# Patient Record
Sex: Female | Born: 2000 | Hispanic: Yes | Marital: Single | Smoking: Never smoker
Health system: Southern US, Community
[De-identification: ages and names within clinical notes are randomized; demographics above are authoritative.]

## PROBLEM LIST (undated history)

## (undated) ENCOUNTER — Emergency Department (HOSPITAL_COMMUNITY): Admission: EM | Payer: Medicaid Other | Source: Home / Self Care

## (undated) DIAGNOSIS — L309 Dermatitis, unspecified: Secondary | ICD-10-CM

## (undated) DIAGNOSIS — J45909 Unspecified asthma, uncomplicated: Secondary | ICD-10-CM

## (undated) DIAGNOSIS — S80211A Abrasion, right knee, initial encounter: Secondary | ICD-10-CM

## (undated) DIAGNOSIS — L509 Urticaria, unspecified: Secondary | ICD-10-CM

## (undated) DIAGNOSIS — R2230 Localized swelling, mass and lump, unspecified upper limb: Secondary | ICD-10-CM

## (undated) DIAGNOSIS — S92911A Unspecified fracture of right toe(s), initial encounter for closed fracture: Secondary | ICD-10-CM

## (undated) DIAGNOSIS — T783XXA Angioneurotic edema, initial encounter: Secondary | ICD-10-CM

## (undated) HISTORY — DX: Dermatitis, unspecified: L30.9

## (undated) HISTORY — DX: Unspecified asthma, uncomplicated: J45.909

## (undated) HISTORY — DX: Urticaria, unspecified: L50.9

## (undated) HISTORY — DX: Angioneurotic edema, initial encounter: T78.3XXA

---
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
## 2022-02-01 DIAGNOSIS — H5213 Myopia, bilateral: Secondary | ICD-10-CM | POA: Diagnosis not present

## 2022-02-01 DIAGNOSIS — H52223 Regular astigmatism, bilateral: Secondary | ICD-10-CM | POA: Diagnosis not present

## 2022-02-06 NOTE — Telephone Encounter (Signed)
School forms have been up front and have not been picked up. Copy is scanned into media if needed. Original forms have been shredded.

## 2022-07-01 IMAGING — DX DG CHEST 1V PORT
1 series · 1 of 1 positions shown · non-contrast
Comparison: 09/07/2019

CLINICAL DATA: Cough.  COVID like symptoms.

EXAM:
PORTABLE CHEST 1 VIEW

[chest]
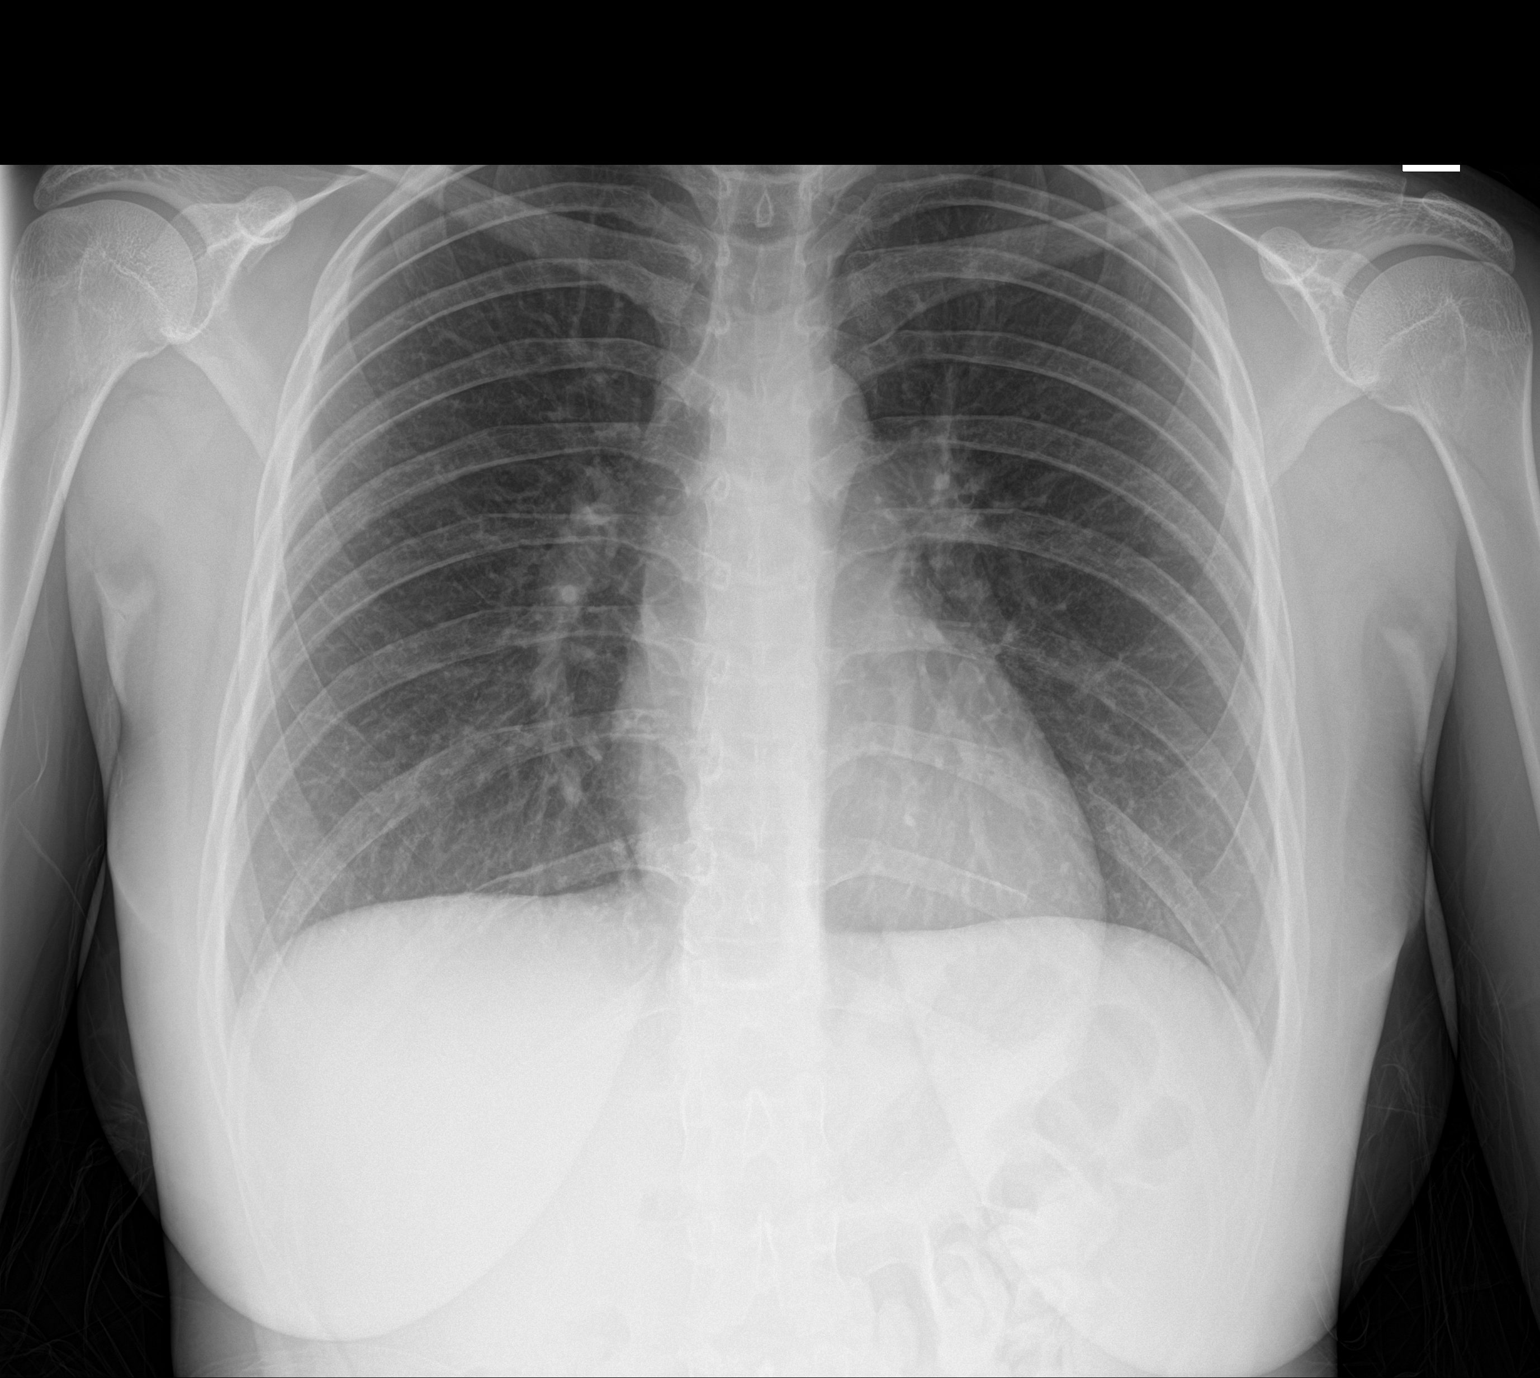

[1 of 1 positions shown; findings below may reference images not displayed]

FINDINGS: The heart size and mediastinal contours are within normal limits.
Both lungs are clear. The visualized skeletal structures are
unremarkable.
IMPRESSION: No active disease.

## 2022-09-10 ENCOUNTER — Telehealth: Payer: Self-pay

## 2022-09-10 NOTE — Telephone Encounter (Signed)
LVM for patient to call back. AS< CMA 

## 2022-10-05 ENCOUNTER — Encounter (HOSPITAL_COMMUNITY): Payer: Self-pay | Admitting: Emergency Medicine

## 2022-10-05 ENCOUNTER — Emergency Department (HOSPITAL_COMMUNITY): Payer: Medicaid Other

## 2022-10-05 ENCOUNTER — Other Ambulatory Visit: Payer: Self-pay

## 2022-10-05 ENCOUNTER — Emergency Department (HOSPITAL_COMMUNITY)
Admission: EM | Admit: 2022-10-05 | Discharge: 2022-10-06 | Disposition: A | Discharge status: Home / Self Care | Payer: Medicaid Other | Attending: Emergency Medicine | Admitting: Emergency Medicine

## 2022-10-05 DIAGNOSIS — R1012 Left upper quadrant pain: Secondary | ICD-10-CM | POA: Diagnosis not present

## 2022-10-05 DIAGNOSIS — R1013 Epigastric pain: Secondary | ICD-10-CM | POA: Diagnosis not present

## 2022-10-05 DIAGNOSIS — R079 Chest pain, unspecified: Secondary | ICD-10-CM | POA: Diagnosis not present

## 2022-10-05 DIAGNOSIS — D72829 Elevated white blood cell count, unspecified: Secondary | ICD-10-CM | POA: Diagnosis not present

## 2022-10-05 DIAGNOSIS — R319 Hematuria, unspecified: Secondary | ICD-10-CM | POA: Diagnosis not present

## 2022-10-05 DIAGNOSIS — R109 Unspecified abdominal pain: Secondary | ICD-10-CM | POA: Diagnosis not present

## 2022-10-05 DIAGNOSIS — R1011 Right upper quadrant pain: Secondary | ICD-10-CM | POA: Insufficient documentation

## 2022-10-05 LAB — I-STAT BETA HCG BLOOD, ED (MC, WL, AP ONLY): I-stat hCG, quantitative: <5 m[IU]/mL (ref <5)

## 2022-10-05 LAB — CBC
HCT: 39.1 % (ref 36.0–46.0)
Hemoglobin: 12.9 g/dL (ref 12.0–15.0)
MCH: 27.2 pg (ref 26.0–34.0)
MCHC: 33 g/dL (ref 30.0–36.0)
MCV: 82.5 fL (ref 80.0–100.0)
Platelets: 361 10*3/uL (ref 150–400)
RBC: 4.74 MIL/uL (ref 3.87–5.11)
RDW: 14.5 % (ref 11.5–15.5)
WBC: 11.3 10*3/uL — ABNORMAL HIGH (ref 4.0–10.5)
nRBC: 0 % (ref 0.0–0.2)

## 2022-10-05 LAB — COMPREHENSIVE METABOLIC PANEL
ALT: 14 U/L (ref 0–44)
AST: 19 U/L (ref 15–41)
Albumin: 4.5 g/dL (ref 3.5–5.0)
Alkaline Phosphatase: 93 U/L (ref 38–126)
Anion gap: 12 (ref 5–15)
BUN: 6 mg/dL (ref 6–20)
CO2: 22 mmol/L (ref 22–32)
Calcium: 9.3 mg/dL (ref 8.9–10.3)
Chloride: 103 mmol/L (ref 98–111)
Creatinine, Ser: 0.6 mg/dL (ref 0.44–1.00)
GFR, Estimated: >60 mL/min (ref >60)
Glucose, Bld: 112 mg/dL — ABNORMAL HIGH (ref 70–99)
Potassium: 3.7 mmol/L (ref 3.5–5.1)
Sodium: 137 mmol/L (ref 135–145)
Total Bilirubin: 0.3 mg/dL (ref 0.3–1.2)
Total Protein: 8.2 g/dL — ABNORMAL HIGH (ref 6.5–8.1)

## 2022-10-05 LAB — TROPONIN I (HIGH SENSITIVITY): Troponin I (High Sensitivity): <2 ng/L (ref <18)

## 2022-10-05 LAB — LIPASE, BLOOD: Lipase: 38 U/L (ref 11–51)

## 2022-10-05 NOTE — ED Triage Notes (Signed)
Pt in with R flank pain that began yesterday, and pain now radiates into chest since this morning. +nausea. Pt denies urinary symptoms or fevers, states the pain does radiate into RUQ as well.

## 2022-10-06 ENCOUNTER — Emergency Department (HOSPITAL_COMMUNITY): Payer: Medicaid Other

## 2022-10-06 DIAGNOSIS — R109 Unspecified abdominal pain: Secondary | ICD-10-CM | POA: Diagnosis not present

## 2022-10-06 LAB — URINALYSIS, ROUTINE W REFLEX MICROSCOPIC: RBC / HPF: >50 RBC/hpf (ref 0–5)

## 2022-10-06 LAB — TROPONIN I (HIGH SENSITIVITY): Troponin I (High Sensitivity): <2 ng/L (ref <18)

## 2022-10-06 MED ORDER — DICYCLOMINE HCL 20 MG PO TABS: 20.0000 mg | ORAL_TABLET | Freq: Two times a day (BID) | ORAL | 0 refills | Status: DC | PRN

## 2022-10-06 MED ORDER — ONDANSETRON HCL 4 MG/2ML IJ SOLN
4.0000 mg | Freq: Once | INTRAMUSCULAR | Status: AC
  Administered 2022-10-06: 4 mg via INTRAVENOUS
  Filled 2022-10-06: qty 2

## 2022-10-06 MED ORDER — HYDROMORPHONE HCL 1 MG/ML IJ SOLN
0.5000 mg | Freq: Once | INTRAMUSCULAR | Status: AC
  Administered 2022-10-06: 0.5 mg via INTRAVENOUS
  Filled 2022-10-06: qty 1

## 2022-10-06 MED ORDER — SODIUM CHLORIDE 0.9 % IV BOLUS
1000.0000 mL | Freq: Once | INTRAVENOUS | Status: AC
Start: 2022-10-06 — End: 2022-10-06
  Administered 2022-10-06: 1000 mL via INTRAVENOUS

## 2022-10-06 MED ORDER — IOHEXOL 350 MG/ML SOLN
75.0000 mL | Freq: Once | INTRAVENOUS | Status: AC | PRN
  Administered 2022-10-06: 75 mL via INTRAVENOUS

## 2022-10-06 MED ORDER — FAMOTIDINE 20 MG PO TABS: 20.0000 mg | ORAL_TABLET | Freq: Two times a day (BID) | ORAL | 0 refills | Status: AC

## 2022-10-06 MED ORDER — ONDANSETRON HCL 4 MG PO TABS: 4.0000 mg | ORAL_TABLET | Freq: Four times a day (QID) | ORAL | 0 refills | Status: AC

## 2022-10-06 NOTE — Discharge Instructions (Addendum)
Likely you have a viral stomach bug which should resolve on its own, I recommend a bland diet and then increasing diet as tolerated.  I have given you a medication called Zofran and this will help with nausea, Pepcid which will help with stomach acid, and Bentyl for stomach spasms.  Follow-up with your PCP as needed  Come back to the emergency department if you develop chest pain, shortness of breath, severe abdominal pain, uncontrolled nausea, vomiting, diarrhea.

## 2022-10-06 NOTE — ED Notes (Signed)
Pt attempting for urine sample at this time. Still c/o RUQ/R flank pain

## 2022-10-06 NOTE — ED Provider Notes (Signed)
[place] EMERGENCY DEPARTMENT AT [facility] Provider Note   CSN: [id number] Arrival date & time: 10/05/22  2228     History  Chief Complaint  Patient presents with   Chest Pain   Flank Pain    Heather [name] is a 22 y.o. female.  HPI   Patient without significant medical history presented with complaints of abdominal pain.  Patient states that it started yesterday, came on suddenly, pain is mainly in gastric region as well as her left right upper quadrant, states she will feel into her flanks bilaterally, she denies any urinary symptoms dysuria hematuria but does endorse that she started her menstrual cycle was having vaginal bleeding she is currently not on birth control, no history of ovarian torsion or ectopic pregnancies.  She denies pleuritic chest pain shortness of breath, no history of PEs or DVTs currently not on oral birth control no recent surgeries no long immobilizations.  No abdominal surgeries she has never had anything like the past.    Home Medications Prior to Admission medications   Medication Sig Start Date End Date Taking? Authorizing Provider  cetirizine (ZYRTEC) 10 MG tablet Take 1 tablet (10 mg total) by mouth daily as needed for allergies. Patient not taking: Reported on 07/31/2021 10/25/20   Hetty Blend, FNP  clindamycin-benzoyl peroxide [facility]) gel Apply topically 2 (two) times daily. Patient not taking: Reported on 07/31/2021 10/25/20   Herrin, Purvis Kilts, MD  EPINEPHrine (EPIPEN 2-PAK) 0.3 mg/0.3 mL IJ SOAJ injection Inject 0.3 mg into the muscle once as needed (for severe allergic reaction). CAll 911 immediately if you have to use this medicine Patient not taking: Reported on 07/31/2021 10/25/20   Hetty Blend, FNP  fluticasone [facility]) 50 MCG/ACT nasal spray 2 sprays in each nostril daily as needed. Use for 1-2 weeks at a time before stopping once symptoms improve. Patient not taking: Reported on 07/31/2021 11/15/19   Marcelyn Bruins, MD  PATADAY 0.2 % SOLN Place 1 drop into both eyes daily as needed (itchy/watery/red eyes). Patient not taking: Reported on 07/31/2021 11/15/19   Marcelyn Bruins, MD      Allergies    Catfish [fish allergy], Amoxil [amoxicillin], Blackberry [rubus fruticosus], Blueberry [vaccinium angustifolium], Raspberry, and Shrimp [shellfish allergy]    Review of Systems   Review of Systems  Constitutional:  Negative for chills and fever.  Respiratory:  Negative for shortness of breath.   Cardiovascular:  Negative for chest pain.  Gastrointestinal:  Positive for abdominal pain and diarrhea. Negative for vomiting.  Genitourinary:  Positive for flank pain and vaginal bleeding.  Neurological:  Negative for headaches.    Physical Exam Updated Vital Signs BP 132/82 (BP Location: Left Arm)   Pulse 76   Temp 98.5 F (36.9 C) (Oral)   Resp 16   Wt 59 kg   LMP 10/04/2022   SpO2 97%   BMI 25.39 kg/m  Physical Exam Vitals and nursing note reviewed.  Constitutional:      General: She is not in acute distress.    Appearance: She is not ill-appearing.  HENT:     Head: Normocephalic and atraumatic.     Nose: No congestion.  Eyes:     Conjunctiva/sclera: Conjunctivae normal.  Cardiovascular:     Rate and Rhythm: Normal rate and regular rhythm.     Pulses: Normal pulses.     Heart sounds: No murmur heard.    No friction rub. No gallop.  Pulmonary:  Effort: No respiratory distress.     Breath sounds: No wheezing, rhonchi or rales.  Abdominal:     Palpations: Abdomen is soft.     Tenderness: There is abdominal tenderness. There is no right CVA tenderness or left CVA tenderness.     Comments: Abdomen nondistended, soft, noted tenderness in the epigastric as well as right left upper quadrant, she has slight flank tenderness bilaterally, no guarding rebound tenderness or peritoneal sign she had no tenderness on the lower abdomen or suprapubic region  Musculoskeletal:      Right lower leg: No edema.     Left lower leg: No edema.     Comments: There is no unilateral leg swelling no calf tenderness no palpable cords.  Skin:    General: Skin is warm and dry.  Neurological:     Mental Status: She is alert.  Psychiatric:        Mood and Affect: Mood normal.     ED Results / Procedures / Treatments   Labs (all labs ordered are listed, but only abnormal results are displayed) Labs Reviewed  CBC - Abnormal; Notable for the following components:      Result Value   WBC 11.3 (*)    All other components within normal limits  COMPREHENSIVE METABOLIC PANEL - Abnormal; Notable for the following components:   Glucose, Bld 112 (*)    Total Protein 8.2 (*)    All other components within normal limits  URINALYSIS, ROUTINE W REFLEX MICROSCOPIC - Abnormal; Notable for the following components:   Color, Urine RED (*)    APPearance TURBID (*)    Glucose, UA   (*)    Value: TEST NOT REPORTED DUE TO COLOR INTERFERENCE OF URINE PIGMENT   Hgb urine dipstick   (*)    Value: TEST NOT REPORTED DUE TO COLOR INTERFERENCE OF URINE PIGMENT   Bilirubin Urine   (*)    Value: TEST NOT REPORTED DUE TO COLOR INTERFERENCE OF URINE PIGMENT   Ketones, ur   (*)    Value: TEST NOT REPORTED DUE TO COLOR INTERFERENCE OF URINE PIGMENT   Protein, ur   (*)    Value: TEST NOT REPORTED DUE TO COLOR INTERFERENCE OF URINE PIGMENT   Nitrite   (*)    Value: TEST NOT REPORTED DUE TO COLOR INTERFERENCE OF URINE PIGMENT   Leukocytes,Ua   (*)    Value: TEST NOT REPORTED DUE TO COLOR INTERFERENCE OF URINE PIGMENT   Bacteria, UA FEW (*)    All other components within normal limits  LIPASE, BLOOD  I-STAT BETA HCG BLOOD, ED ([facility], [facility], [facility] ONLY)  TROPONIN I (HIGH SENSITIVITY)  TROPONIN I (HIGH SENSITIVITY)    EKG None  Radiology DG Chest 2 View  Result Date: 10/05/2022 CLINICAL DATA:  Chest pain and pressure. EXAM: CHEST - 2 VIEW COMPARISON:  07/29/2021. FINDINGS: The heart size and mediastinal  contours are within normal limits. Both lungs are clear. No acute osseous abnormality. IMPRESSION: No active cardiopulmonary disease. Electronically Signed   By: Thornell Sartorius M.D.   On: 10/05/2022 23:29    Procedures Procedures    Medications Ordered in ED Medications  HYDROmorphone (DILAUDID) injection 0.5 mg (has no administration in time range)  sodium chloride 0.9 % bolus 1,000 mL (has no administration in time range)  ondansetron (ZOFRAN) injection 4 mg (has no administration in time range)    ED Course/ Medical Decision Making/ A&P  Medical Decision Making Amount and/or Complexity of Data Reviewed Labs: ordered. Radiology: ordered.  Risk Prescription drug management.   This patient presents to the ED for concern of abdominal pain, this involves an extensive number of treatment options, and is a complaint that carries with it a high risk of complications and morbidity.  The differential diagnosis includes lower lobe pneumonia, PE, pancreatitis, cholecystitis, kidney stone, pyelonephritis    Additional history obtained:  Additional history obtained from mother at bedside External records from outside source obtained and reviewed including ER notes   Co morbidities that complicate the patient evaluation  N/A  Social Determinants of Health:  N/A    Lab Tests:  I Ordered, and personally interpreted labs.  The pertinent results include: CBC shows slight leukocytosis 11.3, CMP reveals glucose 112, total protein 8.2, lipase 38, UA shows red blood cells few white blood cells few bacteria hCG negative negative delta troponin   Imaging Studies ordered:  I ordered imaging studies including chest x-ray, CT abdomen pelvis I independently visualized and interpreted imaging which showed chest x-ray negative I agree with the radiologist interpretation   Cardiac Monitoring:  The patient was maintained on a cardiac monitor.  I personally  viewed and interpreted the cardiac monitored which showed an underlying rhythm of: EKG without signs of ischemia   Medicines ordered and prescription drug management:  I ordered medication including fluids, pain medication, antiemetics, I have reviewed the patients home medicines and have made adjustments as needed  Critical Interventions:  N/A   Reevaluation:  With abdominal pain triage obtain basic lab workup imaging which I personally reviewed, notable for leukocytosis as well as hematuria, patient noted abdominal tenderness, unclear if this is pyelonephritis versus kidney stone versus acute cholecystitis will obtain CT imaging for further evaluation.  Patient is reassessed resting comfortably having no complaints she is agreement with discharge at this time.    Consultations Obtained:  N/A    Test Considered:  N/A    Rule out Doubt ectopic pregnancy as hCG is negative.  Suspicion for ovarian torsion is low at this time she has no adnexal pain no suprapubic pain no noted adnexal mass seen on CT imaging.  Suspicion for PID is low she denies any pelvic pain vaginal discharge.  I doubt UTI or [place] or kidney stone patient denies any urinary symptoms CT imaging negative for evidence of inflammation around the kidneys or stone.  She does have blood in her urine but this is likely secondary due to her menstrual cycle.  Suspicion for cholecystitis, cholangitis, choledocholithiasis is low as no elevation in liver enzymes, alk phos, T. bili, no ductal dilation seen on CT imaging.  I do not pancreatitis lipase within normal limits.  I doubt intra-abdominal infection, volvulus, bowel obstruction CT imaging negative these findings.  I doubt lower lobe pneumonia as lung sounds are clear bilaterally.  I doubt PE as she is PERC negative, I doubt ACS as she has negative delta troponin EKG without signs of ischemia.    Dispostion and problem list  After consideration of the diagnostic  results and the patients response to treatment, I feel that the patent would benefit from discharge.  Abdominal pain-suspect viral gastritis, will recommend bland diet, provide with antiemetics follow-up PCP for further evaluation and strict return precautions.            Final Clinical Impression(s) / ED Diagnoses Final diagnoses:  None    Rx / DC Orders ED Discharge Orders  None         Carroll Sage, PA-C 10/06/22 [id number]    Sabas Sous, MD 10/06/22 [phone]

## 2022-11-12 ENCOUNTER — Emergency Department (HOSPITAL_COMMUNITY): Payer: Medicaid Other

## 2022-11-12 ENCOUNTER — Emergency Department (HOSPITAL_COMMUNITY)
Admission: EM | Admit: 2022-11-12 | Discharge: 2022-11-12 | Disposition: A | Discharge status: Home / Self Care | Payer: Medicaid Other | Attending: Emergency Medicine | Admitting: Emergency Medicine

## 2022-11-12 ENCOUNTER — Other Ambulatory Visit: Payer: Self-pay

## 2022-11-12 DIAGNOSIS — Z79899 Other long term (current) drug therapy: Secondary | ICD-10-CM | POA: Insufficient documentation

## 2022-11-12 DIAGNOSIS — R1013 Epigastric pain: Secondary | ICD-10-CM | POA: Diagnosis not present

## 2022-11-12 DIAGNOSIS — R110 Nausea: Secondary | ICD-10-CM | POA: Insufficient documentation

## 2022-11-12 DIAGNOSIS — R197 Diarrhea, unspecified: Secondary | ICD-10-CM | POA: Diagnosis not present

## 2022-11-12 DIAGNOSIS — R1084 Generalized abdominal pain: Secondary | ICD-10-CM | POA: Diagnosis not present

## 2022-11-12 DIAGNOSIS — R109 Unspecified abdominal pain: Secondary | ICD-10-CM | POA: Diagnosis present

## 2022-11-12 LAB — URINALYSIS, ROUTINE W REFLEX MICROSCOPIC
Bilirubin Urine: NEGATIVE
Glucose, UA: NEGATIVE mg/dL
Hgb urine dipstick: NEGATIVE
Ketones, ur: NEGATIVE mg/dL
Leukocytes,Ua: NEGATIVE
Nitrite: NEGATIVE
Protein, ur: NEGATIVE mg/dL
Specific Gravity, Urine: 1.011 (ref 1.005–1.030)
pH: 6 (ref 5.0–8.0)

## 2022-11-12 LAB — CBC WITH DIFFERENTIAL/PLATELET
Abs Immature Granulocytes: 0.05 10*3/uL (ref 0.00–0.07)
Basophils Absolute: 0 10*3/uL (ref 0.0–0.1)
Basophils Relative: 0 %
Eosinophils Absolute: 0.3 10*3/uL (ref 0.0–0.5)
Eosinophils Relative: 2 %
HCT: 37.3 % (ref 36.0–46.0)
Hemoglobin: 12.3 g/dL (ref 12.0–15.0)
Immature Granulocytes: 0 %
Lymphocytes Relative: 16 %
Lymphs Abs: 1.9 10*3/uL (ref 0.7–4.0)
MCH: 27.4 pg (ref 26.0–34.0)
MCHC: 33 g/dL (ref 30.0–36.0)
MCV: 83.1 fL (ref 80.0–100.0)
Monocytes Absolute: 0.5 10*3/uL (ref 0.1–1.0)
Monocytes Relative: 4 %
Neutro Abs: 9.4 10*3/uL — ABNORMAL HIGH (ref 1.7–7.7)
Neutrophils Relative %: 78 %
Platelets: 278 10*3/uL (ref 150–400)
RBC: 4.49 MIL/uL (ref 3.87–5.11)
RDW: 14 % (ref 11.5–15.5)
WBC: 12.1 10*3/uL — ABNORMAL HIGH (ref 4.0–10.5)
nRBC: 0 % (ref 0.0–0.2)

## 2022-11-12 LAB — COMPREHENSIVE METABOLIC PANEL
ALT: 116 U/L — ABNORMAL HIGH (ref 0–44)
AST: 258 U/L — ABNORMAL HIGH (ref 15–41)
Albumin: 4.4 g/dL (ref 3.5–5.0)
Alkaline Phosphatase: 101 U/L (ref 38–126)
Anion gap: 10 (ref 5–15)
BUN: 10 mg/dL (ref 6–20)
CO2: 22 mmol/L (ref 22–32)
Calcium: 9.1 mg/dL (ref 8.9–10.3)
Chloride: 104 mmol/L (ref 98–111)
Creatinine, Ser: 0.56 mg/dL (ref 0.44–1.00)
GFR, Estimated: >60 mL/min (ref >60)
Glucose, Bld: 120 mg/dL — ABNORMAL HIGH (ref 70–99)
Potassium: 3.3 mmol/L — ABNORMAL LOW (ref 3.5–5.1)
Sodium: 136 mmol/L (ref 135–145)
Total Bilirubin: 0.8 mg/dL (ref 0.3–1.2)
Total Protein: 7.7 g/dL (ref 6.5–8.1)

## 2022-11-12 LAB — LIPASE, BLOOD: Lipase: 33 U/L (ref 11–51)

## 2022-11-12 LAB — RAPID URINE DRUG SCREEN, HOSP PERFORMED
Amphetamines: NOT DETECTED
Barbiturates: NOT DETECTED
Benzodiazepines: NOT DETECTED
Cocaine: NOT DETECTED
Opiates: NOT DETECTED
Tetrahydrocannabinol: NOT DETECTED

## 2022-11-12 MED ORDER — ONDANSETRON HCL 4 MG/2ML IJ SOLN
4.0000 mg | Freq: Once | INTRAMUSCULAR | Status: AC
  Administered 2022-11-12: 4 mg via INTRAVENOUS
  Filled 2022-11-12: qty 2

## 2022-11-12 MED ORDER — POTASSIUM CHLORIDE CRYS ER 20 MEQ PO TBCR
40.0000 meq | EXTENDED_RELEASE_TABLET | Freq: Once | ORAL | Status: AC
  Administered 2022-11-12: 40 meq via ORAL
  Filled 2022-11-12: qty 2

## 2022-11-12 MED ORDER — LACTATED RINGERS IV BOLUS
2000.0000 mL | Freq: Once | INTRAVENOUS | Status: AC
  Administered 2022-11-12: 2000 mL via INTRAVENOUS

## 2022-11-12 MED ORDER — ONDANSETRON HCL 4 MG/2ML IJ SOLN
4.0000 mg | Freq: Once | INTRAMUSCULAR | Status: DC
  Filled 2022-11-12: qty 2

## 2022-11-12 MED ORDER — ONDANSETRON 4 MG PO TBDP: 4.0000 mg | ORAL_TABLET | Freq: Three times a day (TID) | ORAL | 0 refills | Status: AC | PRN

## 2022-11-12 MED ORDER — DICYCLOMINE HCL 20 MG PO TABS: 20.0000 mg | ORAL_TABLET | Freq: Two times a day (BID) | ORAL | 0 refills | Status: AC

## 2022-11-12 MED ORDER — MORPHINE SULFATE (PF) 4 MG/ML IV SOLN
4.0000 mg | Freq: Once | INTRAVENOUS | Status: DC
  Filled 2022-11-12: qty 1

## 2022-11-12 MED ORDER — PANTOPRAZOLE SODIUM 40 MG PO TBEC: 40.0000 mg | DELAYED_RELEASE_TABLET | Freq: Every day | ORAL | 0 refills | Status: AC

## 2022-11-12 MED ORDER — HYDROCODONE-ACETAMINOPHEN 5-325 MG PO TABS
2.0000 | ORAL_TABLET | Freq: Once | ORAL | Status: AC
  Administered 2022-11-12: 2 via ORAL
  Filled 2022-11-12: qty 2

## 2022-11-12 MED ORDER — ONDANSETRON 8 MG PO TBDP
8.0000 mg | ORAL_TABLET | Freq: Once | ORAL | Status: AC
  Administered 2022-11-12: 8 mg via ORAL
  Filled 2022-11-12: qty 1

## 2022-11-12 NOTE — ED Provider Notes (Signed)
[place] EMERGENCY DEPARTMENT AT [facility] Provider Note   CSN: [id number] Arrival date & time: 11/12/22  4782     History  Chief Complaint  Patient presents with   Abdominal Pain    Heather [name] is a 22 y.o. female.  22 year old female presents today for evaluation of abdominal pain that started yesterday.  Associated nausea but no vomiting.  No fever, dysuria, vaginal discharge.  She states pain is generalized however worse in the epigastric region.  Does radiate to the back.  Seen about a month ago for abdominal pain.  States this feels different and is worse.  The history is provided by the patient. No language interpreter was used.       Home Medications Prior to Admission medications   Medication Sig Start Date End Date Taking? Authorizing Provider  cetirizine (ZYRTEC) 10 MG tablet Take 1 tablet (10 mg total) by mouth daily as needed for allergies. Patient not taking: Reported on 07/31/2021 10/25/20   Hetty Blend, FNP  clindamycin-benzoyl peroxide [facility]) gel Apply topically 2 (two) times daily. Patient not taking: Reported on 07/31/2021 10/25/20   Herrin, Purvis Kilts, MD  dicyclomine (BENTYL) 20 MG tablet Take 1 tablet (20 mg total) by mouth 2 (two) times daily as needed for spasms. 10/06/22   Carroll Sage, PA-C  EPINEPHrine (EPIPEN 2-PAK) 0.3 mg/0.3 mL IJ SOAJ injection Inject 0.3 mg into the muscle once as needed (for severe allergic reaction). CAll 911 immediately if you have to use this medicine Patient not taking: Reported on 07/31/2021 10/25/20   Hetty Blend, FNP  famotidine (PEPCID) 20 MG tablet Take 1 tablet (20 mg total) by mouth 2 (two) times daily for 7 days. 10/06/22 10/13/22  Carroll Sage, PA-C  fluticasone (FLONASE) 50 MCG/ACT nasal spray 2 sprays in each nostril daily as needed. Use for 1-2 weeks at a time before stopping once symptoms improve. Patient not taking: Reported on 07/31/2021 11/15/19   Marcelyn Bruins, MD  ondansetron (ZOFRAN) 4 MG tablet Take 1 tablet (4 mg total) by mouth every 6 (six) hours. 10/06/22   Carroll Sage, PA-C  PATADAY 0.2 % SOLN Place 1 drop into both eyes daily as needed (itchy/watery/red eyes). Patient not taking: Reported on 07/31/2021 11/15/19   Marcelyn Bruins, MD      Allergies    Catfish [fish allergy], Amoxil [amoxicillin], Blackberry [rubus fruticosus], Blueberry [vaccinium angustifolium], Raspberry, and Shrimp [shellfish allergy]    Review of Systems   Review of Systems  Constitutional:  Negative for fever.  Gastrointestinal:  Positive for nausea. Negative for abdominal pain and vomiting.  Genitourinary:  Negative for dysuria, flank pain, pelvic pain and vaginal discharge.  All other systems reviewed and are negative.   Physical Exam Updated Vital Signs BP 138/80 (BP Location: Right Arm)   Pulse 87   Temp 98 F (36.7 C) (Oral)   Resp 16   Ht 5\' 1"  (1.549 m)   Wt 59 kg   LMP 11/05/2022 (Approximate)   SpO2 100%   BMI 24.58 kg/m  Physical Exam Vitals and nursing note reviewed.  Constitutional:      General: She is not in acute distress.    Appearance: Normal appearance. She is not ill-appearing.  HENT:     Head: Normocephalic and atraumatic.     Nose: Nose normal.  Eyes:     General: No scleral icterus.    Extraocular Movements: Extraocular movements intact.     Conjunctiva/sclera:  Conjunctivae normal.  Cardiovascular:     Rate and Rhythm: Normal rate and regular rhythm.     Heart sounds: Normal heart sounds.  Pulmonary:     Effort: Pulmonary effort is normal. No respiratory distress.     Breath sounds: Normal breath sounds. No wheezing or rales.  Abdominal:     General: There is no distension.     Tenderness: There is abdominal tenderness (epigastric region). There is no guarding.  Musculoskeletal:        General: Normal range of motion.     Cervical back: Normal range of motion.  Skin:    General: Skin is warm  and dry.  Neurological:     General: No focal deficit present.     Mental Status: She is alert. Mental status is at baseline.     ED Results / Procedures / Treatments   Labs (all labs ordered are listed, but only abnormal results are displayed) Labs Reviewed - No data to display  EKG None  Radiology No results found.  Procedures Procedures    Medications Ordered in ED Medications  morphine (PF) 4 MG/ML injection 4 mg (has no administration in time range)  lactated ringers bolus 2,000 mL (has no administration in time range)  ondansetron (ZOFRAN) injection 4 mg (has no administration in time range)    ED Course/ Medical Decision Making/ A&P                             Medical Decision Making Amount and/or Complexity of Data Reviewed Labs: ordered. Radiology: ordered.  Risk Prescription drug management.   Medical Decision Making / ED Course   This patient presents to the ED for concern of abdominal pain, this involves an extensive number of treatment options, and is a complaint that carries with it a high risk of complications and morbidity.  The differential diagnosis includes gastritis, gastroenteritis, appendicitis, cholecystitis, pancreatitis  MDM: 22 year old female presents today for 1 day duration of abdominal pain associated with nausea, as well as back pain.  No emesis.  Denies other complaints.  Hemodynamically stable.  No acute distress noted.  Will obtain labs, provide fluids and pain control and reevaluate.  Improved on reevaluation.  LFTs elevated.  AST and ALT 258 and 116 respectively.  Potassium 3.3.  Potassium repletion given.  CBC with mild leukocytosis no significant left shift.  No anemia.  UA without UTI.  UDS unremarkable.  Lipase within normal limit.  Right upper quadrant ultrasound obtained given pain as in the epigastric region and right upper quadrant.  LFTs elevated.  No acute findings on ultrasound.  Discussed obtaining a CT scan however she  just had this 1 month ago and defers.  Pain is improved.  Abdominal exam without signs of acute abdomen.  Patient is appropriate for discharge.  Discharged in stable condition.  Will give some Protonix, Bentyl and Zofran.  GI referral given.  Lab Tests: -I ordered, reviewed, and interpreted labs.   The pertinent results include:   Labs Reviewed  CBC WITH DIFFERENTIAL/PLATELET  COMPREHENSIVE METABOLIC PANEL  LIPASE, BLOOD  URINALYSIS, ROUTINE W REFLEX MICROSCOPIC  RAPID URINE DRUG SCREEN, HOSP PERFORMED      EKG  EKG Interpretation  Date/Time:    Ventricular Rate:    PR Interval:    QRS Duration:   QT Interval:    QTC Calculation:   R Axis:     Text Interpretation:  Imaging Studies ordered: I ordered imaging studies including ruq [place] I independently visualized and interpreted imaging. I agree with the radiologist interpretation   Medicines ordered and prescription drug management: Meds ordered this encounter  Medications   morphine (PF) 4 MG/ML injection 4 mg   lactated ringers bolus 2,000 mL   ondansetron (ZOFRAN) injection 4 mg    -I have reviewed the patients home medicines and have made adjustments as needed  Reevaluation: After the interventions noted above, I reevaluated the patient and found that they have :improved  Co morbidities that complicate the patient evaluation  Past Medical History:  Diagnosis Date   Abrasion of knee, right 01/10/2015   Angio-edema    Mass of arm 12/2014   nodular swellings left upper arm   Toe fracture, right    Urticaria       Dispostion: Patient discharged in stable condition peer return precaution discussed.  Patient voiced understanding and is in agreement with plan.  Final Clinical Impression(s) / ED Diagnoses Final diagnoses:  Epigastric pain    Rx / DC Orders ED Discharge Orders          Ordered    pantoprazole (PROTONIX) 40 MG tablet  Daily        11/12/22 1203    ondansetron (ZOFRAN-ODT) 4  MG disintegrating tablet  Every 8 hours PRN        11/12/22 1206    dicyclomine (BENTYL) 20 MG tablet  2 times daily        11/12/22 1206              Marita Kansas, PA-C 11/12/22 1218    Tegeler, Canary Brim, MD 11/12/22 1336

## 2022-11-12 NOTE — Discharge Instructions (Signed)
Your workup is overall reassuring.  Ultrasound does not show any concerning findings.  Have given you referral to gastroenterology.  I have sent nausea medication into the pharmacy for you.  If you have any concerning symptoms return to the emergency room.

## 2023-04-16 DIAGNOSIS — H5213 Myopia, bilateral: Secondary | ICD-10-CM | POA: Diagnosis not present

## 2023-12-03 ENCOUNTER — Ambulatory Visit: Admitting: Family Medicine

## 2023-12-03 ENCOUNTER — Encounter: Payer: Self-pay | Admitting: Family Medicine

## 2023-12-03 VITALS — BP 130/80 | HR 108 | Temp 98.8°F | Ht 61.0 in | Wt 139.0 lb

## 2023-12-03 DIAGNOSIS — L700 Acne vulgaris: Secondary | ICD-10-CM

## 2023-12-03 MED ORDER — RETIN-A 0.05 % EX CREA
TOPICAL_CREAM | Freq: Every day | CUTANEOUS | 0 refills | Status: AC
Start: 2023-12-03 — End: ?

## 2023-12-03 NOTE — Progress Notes (Signed)
 New Patient Visit  Subjective:     Patient ID: Heather [name], female    DOB: July 28, 2000, 22 y.o.   MRN: [id number]  No chief complaint on file.   HPI  Discussed the use of AI scribe software for clinical note transcription with the patient, who gave verbal consent to proceed.  History of Present Illness Heather [name] is a 23 year old female who presents with persistent acne.  Acneiform eruptions - Persistent, intermittent acne with most severe outbreaks on the back - Minimal relief with over-the-counter treatments, including Dove Sensitive bar soap - No significant improvement with Panoxyl wash - No associated symptoms such as pruritus, pain, or drainage  Constitutional symptoms - No issues with eating or sleeping - Feels generally well aside from acne - No other symptoms present     ROS Per HPI  Outpatient Encounter Medications as of 12/03/2023  Medication Sig   tretinoin (RETIN-A) 0.05 % cream Apply topically at bedtime.   cetirizine  (ZYRTEC ) 10 MG tablet Take 1 tablet (10 mg total) by mouth daily as needed for allergies. (Patient not taking: Reported on 07/31/2021)   clindamycin -benzoyl peroxide  (BENZACLIN) gel Apply topically 2 (two) times daily. (Patient not taking: Reported on 07/31/2021)   dicyclomine  (BENTYL ) 20 MG tablet Take 1 tablet (20 mg total) by mouth 2 (two) times daily.   EPINEPHrine  (EPIPEN  2-PAK) 0.3 mg/0.3 mL IJ SOAJ injection Inject 0.3 mg into the muscle once as needed (for severe allergic reaction). CAll 911 immediately if you have to use this medicine (Patient not taking: Reported on 07/31/2021)   famotidine  (PEPCID ) 20 MG tablet Take 1 tablet (20 mg total) by mouth 2 (two) times daily for 7 days.   fluticasone  (FLONASE ) 50 MCG/ACT nasal spray 2 sprays in each nostril daily as needed. Use for 1-2 weeks at a time before stopping once symptoms improve. (Patient not taking: Reported on 07/31/2021)   ondansetron  (ZOFRAN ) 4 MG  tablet Take 1 tablet (4 mg total) by mouth every 6 (six) hours.   ondansetron  (ZOFRAN -ODT) 4 MG disintegrating tablet Take 1 tablet (4 mg total) by mouth every 8 (eight) hours as needed.   pantoprazole  (PROTONIX ) 40 MG tablet Take 1 tablet (40 mg total) by mouth daily.   PATADAY  0.2 % SOLN Place 1 drop into both eyes daily as needed (itchy/watery/red eyes). (Patient not taking: Reported on 07/31/2021)   No facility-administered encounter medications on file as of 12/03/2023.    Past Medical History:  Diagnosis Date   Abrasion of knee, right 01/10/2015   Angio-edema    Asthma    Eczema    Mass of arm 12/2014   nodular swellings left upper arm   Toe fracture, right    Urticaria     Past Surgical History:  Procedure Laterality Date   MASS EXCISION Left 01/17/2015   Procedure: EXCISION  OF NODULAR SWELLINGS IN LEFT UPPER ARM x2;  Surgeon: Julietta Millman, MD;  Location: [facility];  Service: Pediatrics;  Laterality: Left;    Family History  Problem Relation Age of Onset   Diabetes Maternal Grandfather    Heart disease Maternal Grandfather        open heart surgery   Allergies Sister        amoxicillin, blueberries, blackberries.    Allergic rhinitis Neg Hx    Angioedema Neg Hx    Asthma Neg Hx    Atopy Neg Hx    Eczema Neg Hx    Immunodeficiency Neg Hx  Urticaria Neg Hx     Social History   Socioeconomic History   Marital status: Single    Spouse name: Not on file   Number of children: Not on file   Years of education: Not on file   Highest education level: Not on file  Occupational History   Not on file  Tobacco Use   Smoking status: Never    Passive exposure: Yes   Smokeless tobacco: Never   Tobacco comments:    father smokes outside  Vaping Use   Vaping status: Never Used  Substance and Sexual Activity   Alcohol use: No   Drug use: No   Sexual activity: Never  Other Topics Concern   Not on file  Social History Narrative   Not on file    Social Drivers of Health   Financial Resource Strain: Not on file  Food Insecurity: Not on file  Transportation Needs: Not on file  Physical Activity: Not on file  Stress: Not on file  Social Connections: Not on file  Intimate Partner Violence: Not on file       Objective:    BP 130/80 (BP Location: Left Arm, Patient Position: Sitting)   Pulse (!) 108   Temp 98.8 F (37.1 C) (Temporal)   Ht 5' 1 (1.549 m)   Wt 139 lb (63 kg)   LMP 11/26/2023 (Exact Date)   SpO2 100%   BMI 26.26 kg/m    Physical Exam Vitals and nursing note reviewed.  Constitutional:      General: She is not in acute distress.    Appearance: Normal appearance. She is normal weight.  HENT:     Head: Normocephalic and atraumatic.     Right Ear: External ear normal.     Left Ear: External ear normal.     Nose: Nose normal.     Mouth/Throat:     Mouth: Mucous membranes are moist.     Pharynx: Oropharynx is clear.  Eyes:     Extraocular Movements: Extraocular movements intact.     Pupils: Pupils are equal, round, and reactive to light.  Cardiovascular:     Rate and Rhythm: Normal rate and regular rhythm.     Pulses: Normal pulses.     Heart sounds: Normal heart sounds.  Pulmonary:     Effort: Pulmonary effort is normal. No respiratory distress.     Breath sounds: Normal breath sounds. No wheezing, rhonchi or rales.  Musculoskeletal:        General: Normal range of motion.     Cervical back: Normal range of motion.     Right lower leg: No edema.     Left lower leg: No edema.  Lymphadenopathy:     Cervical: No cervical adenopathy.  Skin:    Comments: Multiple comedones to thoracic back  Neurological:     General: No focal deficit present.     Mental Status: She is alert and oriented to person, place, and time.  Psychiatric:        Mood and Affect: Mood normal.        Thought Content: Thought content normal.     No results found for any visits on 12/03/23.      Assessment & Plan:    Assessment and Plan Assessment & Plan Acne vulgaris Chronic back acne with minimal response to OTC treatments. Widespread with some scarring. Topical tretinoin recommended, starting with a lower dose to minimize peeling. - Prescribed lower dose tretinoin for once-daily application, increase to  twice daily as tolerated. - Sent prescription to [facility] at [facility].     No orders of the defined types were placed in this encounter.    Meds ordered this encounter  Medications   tretinoin (RETIN-A) 0.05 % cream    Sig: Apply topically at bedtime.    Dispense:  40 g    Refill:  0    Return in about 8 weeks (around 01/28/2024) for acne f/u.  Corean LITTIE Ku, FNP

## 2023-12-03 NOTE — Patient Instructions (Addendum)
